# Patient Record
Sex: Female | Born: 1995 | Race: Black or African American | Hispanic: No | Marital: Single
Health system: Southern US, Community
[De-identification: ages and names within clinical notes are randomized; demographics above are authoritative.]

## PROBLEM LIST (undated history)

## (undated) HISTORY — PX: OTHER SURGICAL HISTORY: SHX169

---
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
## 2013-06-16 ENCOUNTER — Other Ambulatory Visit (HOSPITAL_COMMUNITY)
Admission: RE | Admit: 2013-06-16 | Discharge: 2013-06-16 | Disposition: A | Discharge status: Home / Self Care | Payer: 59 | Source: Ambulatory Visit | Attending: Emergency Medicine | Admitting: Emergency Medicine

## 2013-06-16 ENCOUNTER — Encounter (HOSPITAL_COMMUNITY): Payer: Self-pay | Admitting: Emergency Medicine

## 2013-06-16 ENCOUNTER — Emergency Department (HOSPITAL_COMMUNITY)
Admission: EM | Admit: 2013-06-16 | Discharge: 2013-06-16 | Disposition: A | Discharge status: Home / Self Care | Payer: 59 | Source: Home / Self Care | Attending: Emergency Medicine | Admitting: Emergency Medicine

## 2013-06-16 DIAGNOSIS — N760 Acute vaginitis: Secondary | ICD-10-CM

## 2013-06-16 DIAGNOSIS — B9689 Other specified bacterial agents as the cause of diseases classified elsewhere: Secondary | ICD-10-CM

## 2013-06-16 DIAGNOSIS — N390 Urinary tract infection, site not specified: Secondary | ICD-10-CM

## 2013-06-16 DIAGNOSIS — Z113 Encounter for screening for infections with a predominantly sexual mode of transmission: Secondary | ICD-10-CM | POA: Insufficient documentation

## 2013-06-16 DIAGNOSIS — A499 Bacterial infection, unspecified: Secondary | ICD-10-CM

## 2013-06-16 LAB — POCT URINALYSIS DIP (DEVICE)
Glucose, UA: NEGATIVE mg/dL
Protein, ur: 100 mg/dL — AB
Urobilinogen, UA: 0.2 mg/dL (ref 0.0–1.0)

## 2013-06-16 MED ORDER — CEPHALEXIN 500 MG PO CAPS: 500.0000 mg | ORAL_CAPSULE | Freq: Three times a day (TID) | ORAL | Status: DC

## 2013-06-16 MED ORDER — METRONIDAZOLE 500 MG PO TABS: 500.0000 mg | ORAL_TABLET | Freq: Two times a day (BID) | ORAL | Status: DC

## 2013-06-16 NOTE — ED Notes (Signed)
Pt reports  Symptoms  Of  Low  Back  Pain    With  Cloudy  Urination  As  Well as  A  Vaginal  Discharge         Symptoms  Began today

## 2013-06-16 NOTE — ED Provider Notes (Signed)
Chief Complaint:   Chief Complaint  Patient presents with  . Back Pain    History of Present Illness:   Courtney Klein is a 17 year old female who has a history of lower back pain since 11 AM today. The pain is now gone away completely. She denies any injury to the back or radiation the pain down the legs, numbness, tingling, or weakness in the legs. She had a urinary tract infection years ago and states this feels like the urinary tract infection did. She denies any fever, chills, sweats, nausea, vomiting, dysuria, frequency, urgency, or hematuria. She has had some vaginal discharge over the past 2 weeks. She states this has a sweet odor. She denies any malodor, itching, irritation, or pelvic pain. Her menses have been regular, her last menstrual period being October 12. She is sexually active with consistent use of condoms.  Review of Systems:  Other than noted above, the patient denies any of the following symptoms: General:  No fevers, chills, sweats, aches, or fatigue. GI:  No abdominal pain, back pain, nausea, vomiting, diarrhea, or constipation. GU:  No dysuria, frequency, urgency, hematuria, or incontinence. GYN:  No discharge, itching, vulvar pain or lesions, pelvic pain, or abnormal vaginal bleeding.  PMFSH:  Past medical history, family history, social history, meds, and allergies were reviewed.    Physical Exam:   Vital signs:  BP 96/65  Pulse 75  Temp(Src) 98.2 F (36.8 C) (Oral)  Resp 16  SpO2 100%  LMP 06/09/2013 Gen:  Alert, oriented, in no distress. Lungs:  Clear to auscultation, no wheezes, rales or rhonchi. Heart:  Regular rhythm, no gallop or murmer. Abdomen:  Flat and soft. There was slight suprapubic pain to palpation.  No guarding, or rebound.  No hepato-splenomegaly or mass.  Bowel sounds were normally active.  No hernia. Pelvic exam:  Normal external genitalia, vaginal and cervical mucosa were normal. There was a white, malodorous, frothy discharge. No pain on  cervical motion. Uterus was midposition, normal in size and shape and nontender. No adnexal masses or tenderness. DNA probe for gonorrhea, Chlamydia, Trichomonas, Gardnerella, Candida were obtained. Back:  No CVA tenderness.  Skin:  Clear, warm and dry.  Labs:    Results for orders placed during the hospital encounter of 06/16/13  POCT URINALYSIS DIP (DEVICE)      Result Value Range   Glucose, UA NEGATIVE  NEGATIVE mg/dL   Bilirubin Urine NEGATIVE  NEGATIVE   Ketones, ur NEGATIVE  NEGATIVE mg/dL   Specific Gravity, Urine 1.020  1.005 - 1.030   Hgb urine dipstick MODERATE (*) NEGATIVE   pH 8.5 (*) 5.0 - 8.0   Protein, ur 100 (*) NEGATIVE mg/dL   Urobilinogen, UA 0.2  0.0 - 1.0 mg/dL   Nitrite NEGATIVE  NEGATIVE   Leukocytes, UA SMALL (*) NEGATIVE     A urine culture was obtained.  Results are pending at this time and we will call about any positive results.  Assessment: The primary encounter diagnosis was UTI (lower urinary tract infection). A diagnosis of Bacterial vaginosis was also pertinent to this visit.   The back pain could have been due to a urinary tract infection or could be musculoskeletal pain. A culture has been obtained and we'll go ahead and treat. We'll also go ahead and treat for bacterial vaginosis, pending results of DNA probes. Suggested a probiotic for prevention of both urinary tract infection and bacterial vaginosis.  Plan:   1.  Meds:  The following meds were prescribed:  Discharge Medication List as of 06/16/2013  4:29 PM    START taking these medications   Details  cephALEXin (KEFLEX) 500 MG capsule Take 1 capsule (500 mg total) by mouth 3 (three) times daily., Starting 06/16/2013, Until Discontinued, Normal    metroNIDAZOLE (FLAGYL) 500 MG tablet Take 1 tablet (500 mg total) by mouth 2 (two) times daily., Starting 06/16/2013, Until Discontinued, Normal        2.  Patient Education/Counseling:  The patient was given appropriate handouts, self care  instructions, and instructed in symptomatic relief. The patient was told to avoid intercourse for 10 days, get extra fluids, and return for a follow up with her primary care doctor at the completion of treatment for a repeat UA and culture.   3.  Follow up:  The patient was told to follow up if no better in 3 to 4 days, if becoming worse in any way, and given some red flag symptoms such as fever, persistent vomiting, or severe pain which would prompt immediate return.  Follow up here if necessary.     Reuben Likes, MD 06/16/13 [phone]

## 2013-06-17 LAB — URINE CULTURE: Colony Count: NO GROWTH

## 2013-06-17 NOTE — ED Notes (Signed)
GC/Chlamydia neg., Affirm: Candida and Trich neg., Gardnerella pos., Urine culture: No growth.  Pt. adequately treated with Flagyl. Vassie Moselle 06/17/2013

## 2013-07-28 ENCOUNTER — Emergency Department (HOSPITAL_COMMUNITY)
Admission: EM | Admit: 2013-07-28 | Discharge: 2013-07-29 | Discharge status: Against Medical Advice | Payer: 59 | Attending: Emergency Medicine | Admitting: Emergency Medicine

## 2013-07-28 ENCOUNTER — Encounter (HOSPITAL_COMMUNITY): Payer: Self-pay | Admitting: Emergency Medicine

## 2013-07-28 DIAGNOSIS — J309 Allergic rhinitis, unspecified: Secondary | ICD-10-CM | POA: Insufficient documentation

## 2013-07-28 DIAGNOSIS — R059 Cough, unspecified: Secondary | ICD-10-CM | POA: Insufficient documentation

## 2013-07-28 DIAGNOSIS — R05 Cough: Secondary | ICD-10-CM | POA: Insufficient documentation

## 2013-07-28 DIAGNOSIS — Z79899 Other long term (current) drug therapy: Secondary | ICD-10-CM | POA: Insufficient documentation

## 2013-07-28 DIAGNOSIS — R109 Unspecified abdominal pain: Secondary | ICD-10-CM | POA: Insufficient documentation

## 2013-07-28 LAB — URINE MICROSCOPIC-ADD ON

## 2013-07-28 LAB — URINALYSIS, ROUTINE W REFLEX MICROSCOPIC
Glucose, UA: NEGATIVE mg/dL
Specific Gravity, Urine: 1.027 (ref 1.005–1.030)
Urobilinogen, UA: 1 mg/dL (ref 0.0–1.0)
pH: 6 (ref 5.0–8.0)

## 2013-07-28 LAB — POCT PREGNANCY, URINE: Preg Test, Ur: NEGATIVE

## 2013-07-28 NOTE — ED Notes (Signed)
Pt called by registation without answer

## 2013-07-28 NOTE — ED Notes (Signed)
Pt. reports left flank pain with concentrated cloudy urine for 3 days . Pt. also reported rhinorrhea and productive cough for several days , denies fever or chills.

## 2013-07-28 NOTE — ED Notes (Signed)
Pt called with no answer

## 2013-07-28 NOTE — ED Notes (Signed)
Patient called several times without any response.

## 2013-07-29 ENCOUNTER — Encounter (HOSPITAL_COMMUNITY): Payer: Self-pay | Admitting: Emergency Medicine

## 2013-07-29 ENCOUNTER — Emergency Department (HOSPITAL_COMMUNITY)
Admission: EM | Admit: 2013-07-29 | Discharge: 2013-07-29 | Disposition: A | Discharge status: Home / Self Care | Payer: 59 | Source: Home / Self Care

## 2013-07-29 DIAGNOSIS — J329 Chronic sinusitis, unspecified: Secondary | ICD-10-CM

## 2013-07-29 DIAGNOSIS — R0982 Postnasal drip: Secondary | ICD-10-CM

## 2013-07-29 DIAGNOSIS — J9801 Acute bronchospasm: Secondary | ICD-10-CM

## 2013-07-29 DIAGNOSIS — I889 Nonspecific lymphadenitis, unspecified: Secondary | ICD-10-CM

## 2013-07-29 DIAGNOSIS — R05 Cough: Secondary | ICD-10-CM

## 2013-07-29 DIAGNOSIS — R82998 Other abnormal findings in urine: Secondary | ICD-10-CM

## 2013-07-29 LAB — POCT PREGNANCY, URINE: Preg Test, Ur: NEGATIVE

## 2013-07-29 LAB — POCT URINALYSIS DIP (DEVICE)
Bilirubin Urine: NEGATIVE
Glucose, UA: NEGATIVE mg/dL
Specific Gravity, Urine: >=1.03 (ref 1.005–1.030)
Urobilinogen, UA: 0.2 mg/dL (ref 0.0–1.0)

## 2013-07-29 MED ORDER — DESLORATADINE 5 MG PO TABS: 5.0000 mg | ORAL_TABLET | Freq: Every day | ORAL | Status: AC

## 2013-07-29 MED ORDER — PREDNISONE 20 MG PO TABS: ORAL_TABLET | ORAL | Status: DC

## 2013-07-29 MED ORDER — FLUTICASONE PROPIONATE 50 MCG/ACT NA SUSP: 2.0000 | Freq: Every day | NASAL | Status: DC

## 2013-07-29 MED ORDER — ALBUTEROL SULFATE HFA 108 (90 BASE) MCG/ACT IN AERS: 2.0000 | INHALATION_SPRAY | Freq: Four times a day (QID) | RESPIRATORY_TRACT | Status: DC | PRN

## 2013-07-29 MED ORDER — CEPHALEXIN 500 MG PO CAPS: 500.0000 mg | ORAL_CAPSULE | Freq: Four times a day (QID) | ORAL | Status: DC

## 2013-07-29 NOTE — ED Notes (Signed)
C/o chest congestion and cough x 2 months.  No relief with otc meds.   Chest tightness and sob.    Also having lower back pain. States was recently treated for uti but feels like symptoms have returned.   Denies fever, n/v/d

## 2013-07-29 NOTE — ED Provider Notes (Signed)
Medical screening examination/treatment/procedure(s) were performed by resident physician or non-physician practitioner and as supervising physician I was immediately available for consultation/collaboration.   Erilyn Pearman DOUGLAS MD.   Bravlio Luca D Jeanett Antonopoulos, MD 07/29/13 1712 

## 2013-07-29 NOTE — ED Notes (Signed)
Pt name called with no answer 

## 2013-07-29 NOTE — ED Provider Notes (Signed)
CSN: [id number]     Arrival date & time 07/29/13  1002 History   First MD Initiated Contact with Patient 07/29/13 1027     Chief Complaint  Patient presents with  . Cough  . Back Pain   (Consider location/radiation/quality/duration/timing/severity/associated sxs/prior Treatment) HPI Comments: 17 y o F with cough for 2 mo. PND, nasal congestion. Denies fevers, ST or dyspnea. C/O L lower back pain over the 8th and 9th ribs, worse with rotation of the torso and with certain positions.   Patient is a 17 y.o. female presenting with cough and back pain.  Cough Associated symptoms: rhinorrhea   Associated symptoms: no chest pain, no ear pain, no headaches, no myalgias, no shortness of breath and no sore throat   Back Pain Associated symptoms: no chest pain, no dysuria, no headaches and no pelvic pain     History reviewed. No pertinent past medical history. History reviewed. No pertinent past surgical history. History reviewed. No pertinent family history. History  Substance Use Topics  . Smoking status: Never Smoker   . Smokeless tobacco: Not on file  . Alcohol Use: No   OB History   Grav Para Term Preterm Abortions TAB SAB Ect Mult Living                 Review of Systems  Constitutional: Negative.   HENT: Positive for congestion, postnasal drip and rhinorrhea. Negative for ear discharge, ear pain, hearing loss, sore throat and tinnitus.   Eyes: Negative.   Respiratory: Positive for cough. Negative for shortness of breath.   Cardiovascular: Negative for chest pain and palpitations.  Gastrointestinal: Negative.   Genitourinary: Negative for dysuria, urgency, frequency, flank pain, vaginal bleeding, vaginal discharge, difficulty urinating and pelvic pain.  Musculoskeletal: Positive for back pain. Negative for gait problem and myalgias.  Skin: Negative.   Neurological: Negative for dizziness, syncope, speech difficulty and headaches.    Allergies  Review of patient's allergies  indicates no known allergies.  Home Medications   Current Outpatient Rx  Name  Route  Sig  Dispense  Refill  . acetaminophen (TYLENOL) 325 MG tablet   Oral   Take 650 mg by mouth every 6 (six) hours as needed.         Marland Kitchen albuterol (PROVENTIL HFA;VENTOLIN HFA) 108 (90 BASE) MCG/ACT inhaler   Inhalation   Inhale 2 puffs into the lungs every 6 (six) hours as needed for wheezing or shortness of breath.   1 Inhaler   0   . cephALEXin (KEFLEX) 500 MG capsule   Oral   Take 1 capsule (500 mg total) by mouth 3 (three) times daily.   30 capsule   0   . desloratadine (CLARINEX) 5 MG tablet   Oral   Take 1 tablet (5 mg total) by mouth daily.   30 tablet   0   . fluticasone (FLONASE) 50 MCG/ACT nasal spray   Each Nare   Place 2 sprays into both nostrils daily.   16 g   2   . ibuprofen (ADVIL,MOTRIN) 200 MG tablet   Oral   Take 200 mg by mouth every 6 (six) hours as needed.         . metroNIDAZOLE (FLAGYL) 500 MG tablet   Oral   Take 1 tablet (500 mg total) by mouth 2 (two) times daily.   14 tablet   0   . OVER THE COUNTER MEDICATION      aminophen         .  predniSONE (DELTASONE) 20 MG tablet      2 tabs po once daily x 4 days then 1 q d x 3 d. Take with food.   11 tablet   0    BP 102/66  Pulse 94  Temp(Src) 98 F (36.7 C) (Oral)  Resp 16  SpO2 98%  LMP 07/06/2013 Physical Exam  Nursing note and vitals reviewed. Constitutional: She is oriented to person, place, and time. She appears well-developed and well-nourished. No distress.  HENT:  Right Ear: External ear normal.  Left Ear: External ear normal.  Minor postpharyngeal erythema. No exudates. No swelling. Bilateral TMs are normal  Eyes: Conjunctivae and EOM are normal.  Neck: Normal range of motion. Neck supple.  Solitary 0.75 cm lymph node left postauricular. Left mildly enlarged and tender posterior cervical lymph nodes.  Cardiovascular: Normal rate, regular rhythm and normal heart sounds.    Pulmonary/Chest: Effort normal and breath sounds normal. No respiratory distress. She has no wheezes. She has no rales. She exhibits tenderness.  Tenderness over the left posterior ninth and 10th ribs and intercostal muscles. No tenderness or pain along the spine or paraspinal muscles. Demonstrates no evidence of shortness of breath, increased effort, tachypnea or abnormal breath sounds. Speech is clear, smooth, relaxed in full sentences.  Musculoskeletal: Normal range of motion. She exhibits no edema.  Lymphadenopathy:    She has cervical adenopathy.  Neurological: She is alert and oriented to person, place, and time.  Skin: Skin is warm and dry. No rash noted.  Psychiatric: She has a normal mood and affect.    ED Course  Procedures (including critical care time) Labs Review Labs Reviewed  POCT URINALYSIS DIP (DEVICE) - Abnormal; Notable for the following:    Hgb urine dipstick TRACE (*)    Leukocytes, UA TRACE (*)    All other components within normal limits  POCT PREGNANCY, URINE  POCT INFECTIOUS MONO SCREEN   Results for orders placed during the hospital encounter of 07/29/13  POCT URINALYSIS DIP (DEVICE)      Result Value Range   Glucose, UA NEGATIVE  NEGATIVE mg/dL   Bilirubin Urine NEGATIVE  NEGATIVE   Ketones, ur NEGATIVE  NEGATIVE mg/dL   Specific Gravity, Urine >=1.030  1.005 - 1.030   Hgb urine dipstick TRACE (*) NEGATIVE   pH 6.0  5.0 - 8.0   Protein, ur NEGATIVE  NEGATIVE mg/dL   Urobilinogen, UA 0.2  0.0 - 1.0 mg/dL   Nitrite NEGATIVE  NEGATIVE   Leukocytes, UA TRACE (*) NEGATIVE  POCT PREGNANCY, URINE      Result Value Range   Preg Test, Ur NEGATIVE  NEGATIVE  POCT INFECTIOUS MONO SCREEN      Result Value Range   Mono Screen NEGATIVE  NEGATIVE    Imaging Review No results found. Peak Flow est nl is 430.  Results [phone].   MDM   1. Cough   2. Bronchospasm   3. PND (post-nasal drip)   4. Rhinosinusitis   5. Cervical lymphadenitis    Suspect  combo of ocult bronchospasm and persistent PND is etio for cough. Cont to observe lymph nodes, if not improving this week see your doctor. Prednisone flonase clarinex Albuterol Plenty of fluids F/U woth PCP as needed Cludy urine with WBC's, tx with Keflex      Hayden Rasmussen, NP 07/29/13 1222

## 2013-07-31 LAB — URINE CULTURE

## 2013-08-01 NOTE — ED Notes (Signed)
Call patient If these to see if abnormal clinical findings persist.If no urionary symptoms anymore,she can stop cephalexin.If symptoms persist,then give Bactrim DS 1 tab BID x 3 days per Fayrene Helper.

## 2013-08-05 ENCOUNTER — Telehealth (HOSPITAL_COMMUNITY): Payer: Self-pay | Admitting: Emergency Medicine

## 2014-01-09 ENCOUNTER — Other Ambulatory Visit: Payer: Self-pay | Admitting: Family

## 2014-01-09 ENCOUNTER — Ambulatory Visit
Admission: RE | Admit: 2014-01-09 | Discharge: 2014-01-09 | Disposition: A | Discharge status: Home / Self Care | Payer: 59 | Source: Ambulatory Visit | Attending: Family | Admitting: Family

## 2014-01-09 DIAGNOSIS — R059 Cough, unspecified: Secondary | ICD-10-CM

## 2014-01-09 DIAGNOSIS — R05 Cough: Secondary | ICD-10-CM

## 2015-10-20 IMAGING — CR DG CHEST 2V
2 series · 2 of 2 positions shown · non-contrast
Comparison: None.

CLINICAL DATA: Cough

EXAM:
CHEST  2 VIEW

[w chest pa]
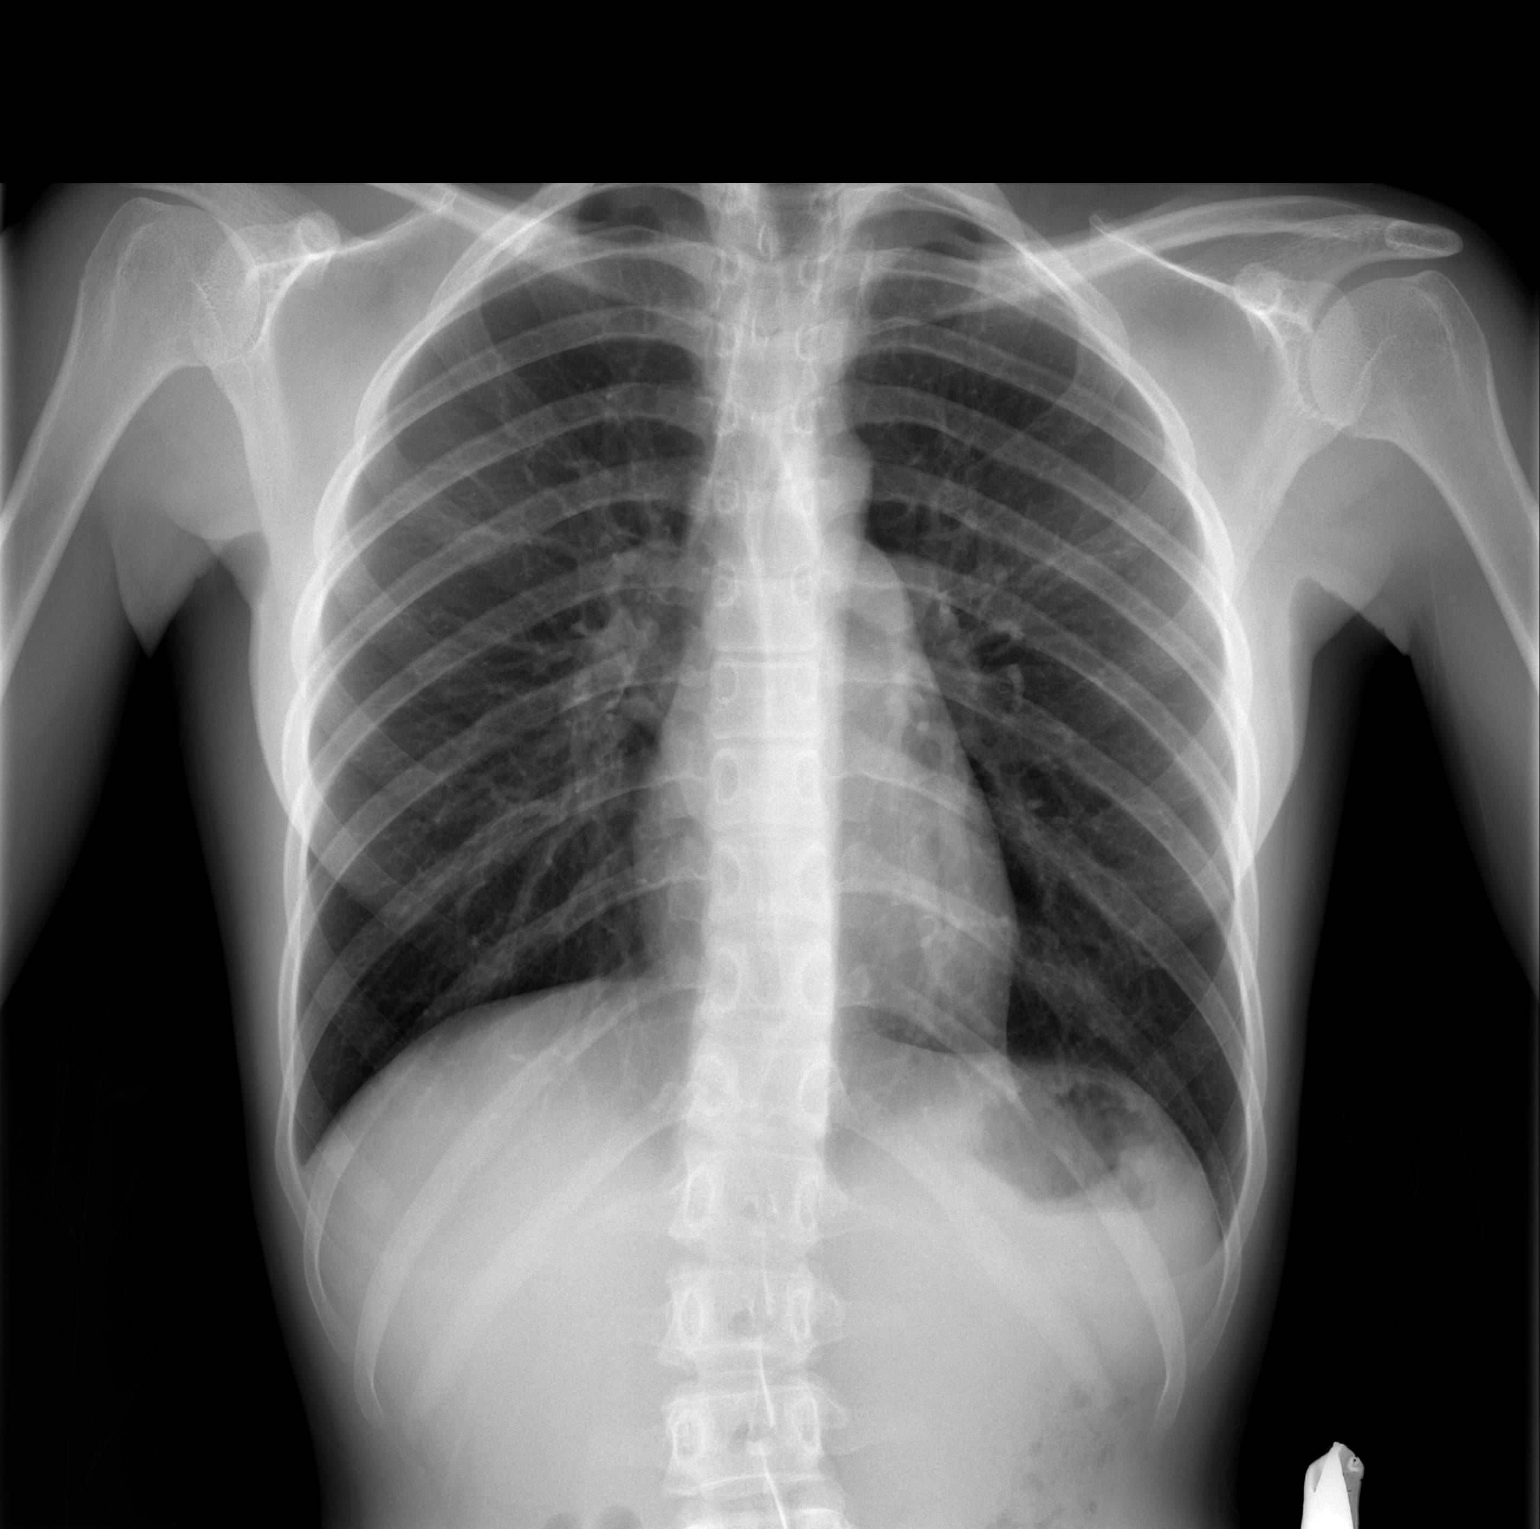

[w chest lat]
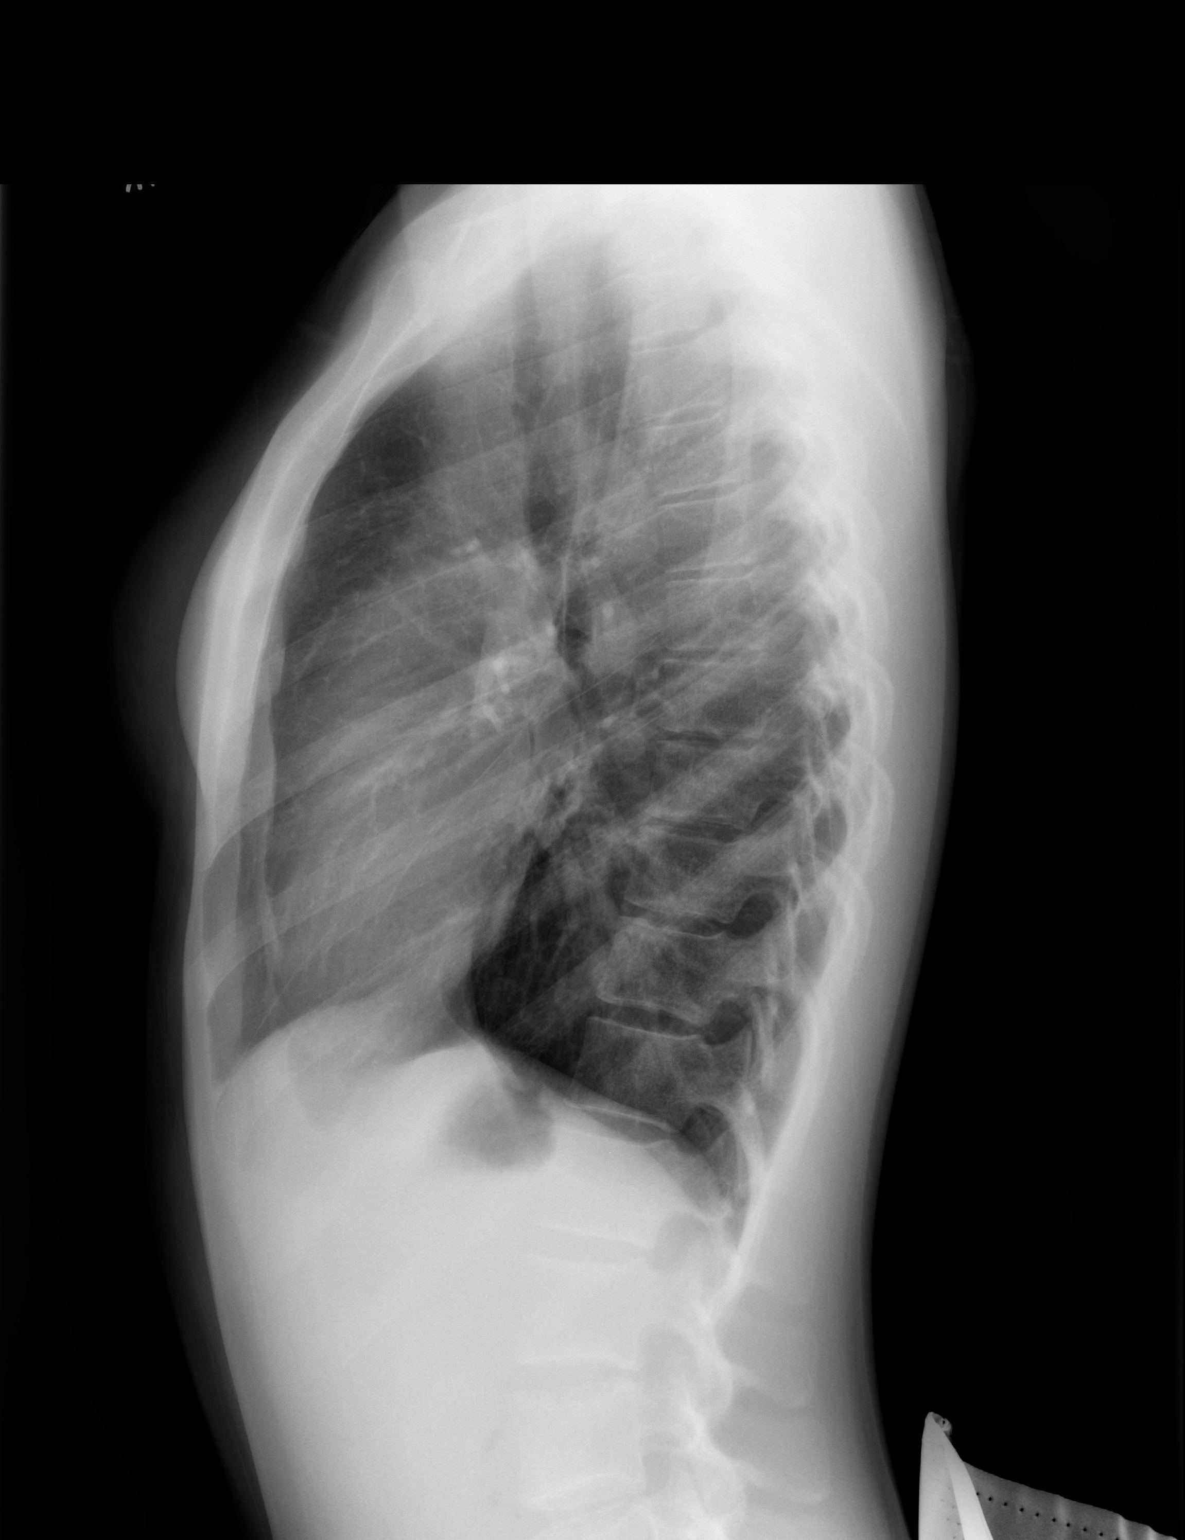

[2 of 2 positions shown; findings below may reference images not displayed]

FINDINGS: Lungs are clear. Heart size and pulmonary vascularity are normal. No
adenopathy. No bone lesions. There is upper lumbar dextroscoliosis.
IMPRESSION: No edema or consolidation.

## 2017-01-14 ENCOUNTER — Telehealth: Payer: Self-pay

## 2017-01-14 ENCOUNTER — Encounter: Payer: Self-pay | Admitting: Allergy and Immunology

## 2017-01-14 ENCOUNTER — Ambulatory Visit (INDEPENDENT_AMBULATORY_CARE_PROVIDER_SITE_OTHER): Payer: 59 | Admitting: Allergy and Immunology

## 2017-01-14 VITALS — BP 92/60 | HR 84 | Temp 98.2°F | Resp 16 | Ht 62.6 in | Wt 113.3 lb

## 2017-01-14 DIAGNOSIS — J4520 Mild intermittent asthma, uncomplicated: Secondary | ICD-10-CM | POA: Diagnosis not present

## 2017-01-14 DIAGNOSIS — H1010 Acute atopic conjunctivitis, unspecified eye: Secondary | ICD-10-CM | POA: Insufficient documentation

## 2017-01-14 DIAGNOSIS — J3089 Other allergic rhinitis: Secondary | ICD-10-CM

## 2017-01-14 DIAGNOSIS — H1013 Acute atopic conjunctivitis, bilateral: Secondary | ICD-10-CM

## 2017-01-14 MED ORDER — OLOPATADINE HCL 0.1 % OP SOLN: 1.0000 [drp] | Freq: Two times a day (BID) | OPHTHALMIC | 12 refills | Status: DC

## 2017-01-14 MED ORDER — MONTELUKAST SODIUM 10 MG PO TABS: 10.0000 mg | ORAL_TABLET | Freq: Every day | ORAL | 5 refills | Status: DC

## 2017-01-14 MED ORDER — ALBUTEROL SULFATE HFA 108 (90 BASE) MCG/ACT IN AERS: 1.0000 | INHALATION_SPRAY | RESPIRATORY_TRACT | 1 refills | Status: DC | PRN

## 2017-01-14 MED ORDER — ALCAFTADINE 0.25 % OP SOLN: OPHTHALMIC | 5 refills | Status: AC

## 2017-01-14 MED ORDER — FLUTICASONE PROPIONATE 50 MCG/ACT NA SUSP: 2.0000 | Freq: Every day | NASAL | 2 refills | Status: AC

## 2017-01-14 MED ORDER — LEVOCETIRIZINE DIHYDROCHLORIDE 5 MG PO TABS: 5.0000 mg | ORAL_TABLET | Freq: Every day | ORAL | 5 refills | Status: DC | PRN

## 2017-01-14 NOTE — Assessment & Plan Note (Signed)
   Treatment plan as outlined above for allergic rhinitis.  A prescription has been provided for Pataday, one drop per eye daily as needed.  I have also recommended eye lubricant drops (i.e., Natural Tears) as needed. 

## 2017-01-14 NOTE — Assessment & Plan Note (Signed)
   Aeroallergen avoidance measures have been discussed and provided in written form.  A prescription has been provided for levocetirizine, 5mg  daily as needed.  A prescription has been provided for montelukast 10 mg daily at bedtime.  A prescription has been provided for Nasonex nasal spray, one spray per nostril 1-2 times daily as needed. Proper nasal spray technique has been discussed and demonstrated.  I have also recommended nasal saline spray (i.e., Simply Saline) or nasal saline lavage (i.e., NeilMed) as needed and prior to medicated nasal sprays.  If allergen avoidance measures and medications fail to adequately relieve symptoms, aeroallergen immunotherapy will be considered.

## 2017-01-14 NOTE — Telephone Encounter (Signed)
[place] [employer], will not cover olopatadine 0.1%.  Preferred is Lastacaft Sol.  OK per Dr. Nunzio CobbsBobbitt to change. Instill one drop in each eye once daily.

## 2017-01-14 NOTE — Patient Instructions (Addendum)
Seasonal and perennial allergic rhinitis  Aeroallergen avoidance measures have been discussed and provided in written form.  A prescription has been provided for levocetirizine, 5mg  daily as needed.  A prescription has been provided for montelukast 10 mg daily at bedtime.  A prescription has been provided for Nasonex nasal spray, one spray per nostril 1-2 times daily as needed. Proper nasal spray technique has been discussed and demonstrated.  I have also recommended nasal saline spray (i.e., Simply Saline) or nasal saline lavage (i.e., NeilMed) as needed and prior to medicated nasal sprays.  If allergen avoidance measures and medications fail to adequately relieve symptoms, aeroallergen immunotherapy will be considered.  Allergic conjunctivitis  Treatment plan as outlined above for allergic rhinitis.  A prescription has been provided for Pataday, one drop per eye daily as needed.  I have also recommended eye lubricant drops (i.e., Natural Tears) as needed.  Mild intermittent asthma  Montelukast has been prescribed a (as above).  A prescription has been provided for albuterol HFA, 1-2 inhalations every 4-6 hours as needed and 15 minutes prior to exercise.  Subjective and objective measures of pulmonary function will be followed and the treatment plan will be adjusted accordingly.   Return in about 3 months (around 04/16/2017), or if symptoms worsen or fail to improve.  Reducing Pollen Exposure  The American Academy of Allergy, Asthma and Immunology suggests the following steps to reduce your exposure to pollen during allergy seasons.    1. Do not hang sheets or clothing out to dry; pollen may collect on these items. 2. Do not mow lawns or spend time around freshly cut grass; mowing stirs up pollen. 3. Keep windows closed at night.  Keep car windows closed while driving. 4. Minimize morning activities outdoors, a time when pollen counts are usually at their highest. 5. Stay  indoors as much as possible when pollen counts or humidity is high and on windy days when pollen tends to remain in the air longer. 6. Use air conditioning when possible.  Many air conditioners have filters that trap the pollen spores. 7. Use a HEPA room air filter to remove pollen form the indoor air you breathe.   Control of Dog or Cat Allergen  Avoidance is the best way to manage a dog or cat allergy. If you have a dog or cat and are allergic to dog or cats, consider removing the dog or cat from the home. If you have a dog or cat but don't want to find it a new home, or if your family wants a pet even though someone in the household is allergic, here are some strategies that may help keep symptoms at bay:  1. Keep the pet out of your bedroom and restrict it to only a few rooms. Be advised that keeping the dog or cat in only one room will not limit the allergens to that room. 2. Don't pet, hug or kiss the dog or cat; if you do, wash your hands with soap and water. 3. High-efficiency particulate air (HEPA) cleaners run continuously in a bedroom or living room can reduce allergen levels over time. 4. Regular use of a high-efficiency vacuum cleaner or a central vacuum can reduce allergen levels. 5. Giving your dog or cat a bath at least once a week can reduce airborne allergen.  Control of Mold Allergen  Mold and fungi can grow on a variety of surfaces provided certain temperature and moisture conditions exist.  Outdoor molds grow on plants, decaying vegetation and  soil.  The major outdoor mold, Alternaria and Cladosporium, are found in very high numbers during hot and dry conditions.  Generally, a late Summer - Fall peak is seen for common outdoor fungal spores.  Rain will temporarily lower outdoor mold spore count, but counts rise rapidly when the rainy period ends.  The most important indoor molds are Aspergillus and Penicillium.  Dark, humid and poorly ventilated basements are ideal sites for  mold growth.  The next most common sites of mold growth are the bathroom and the kitchen.  Outdoor MicrosoftMold Control 1. Use air conditioning and keep windows closed 2. Avoid exposure to decaying vegetation. 3. Avoid leaf raking. 4. Avoid grain handling. 5. Consider wearing a face mask if working in moldy areas.  Indoor Mold Control 1. Maintain humidity below 50%. 2. Clean washable surfaces with 5% bleach solution. 3. Remove sources e.g. Contaminated carpets.

## 2017-01-14 NOTE — Progress Notes (Signed)
New Patient Note  RE: Courtney Klein MRN: [id number] DOB: [birth year] Date of Office Visit: 01/14/2017  Referring provider: No ref. provider found Primary care provider: Patient, No Pcp Per  Chief Complaint: Allergic Rhinitis ; Cough; and Breathing Problem   History of present illness: Courtney Klein is a 21 y.o. female presenting today for evaluation of allergic rhinitis and breathing problems.  She is accompanied today by her mother who assists with a history.  She complains of frequent nasal congestion, rhinorrhea, sneezing, postnasal drainage, nasal pruritus, ocular pruritus, and sinus pressure over the forehead, between the eyes, and over the cheekbones.  The symptoms occur year around but are most frequent and severe during the springtime.  She also complains of coughing, chest tightness, and dyspnea.  She has experienced these symptoms over the past 2 years.  The coughing is worse at nighttime and the dyspnea/chest tightness can be triggered by mild/moderate exertion.   Assessment and plan: Seasonal and perennial allergic rhinitis  Aeroallergen avoidance measures have been discussed and provided in written form.  A prescription has been provided for levocetirizine, 5mg  daily as needed.  A prescription has been provided for montelukast 10 mg daily at bedtime.  A prescription has been provided for Nasonex nasal spray, one spray per nostril 1-2 times daily as needed. Proper nasal spray technique has been discussed and demonstrated.  I have also recommended nasal saline spray (i.e., Simply Saline) or nasal saline lavage (i.e., NeilMed) as needed and prior to medicated nasal sprays.  If allergen avoidance measures and medications fail to adequately relieve symptoms, aeroallergen immunotherapy will be considered.  Allergic conjunctivitis  Treatment plan as outlined above for allergic rhinitis.  A prescription has been provided for Pataday, one drop per eye daily as needed.  I have  also recommended eye lubricant drops (i.e., Natural Tears) as needed.  Mild intermittent asthma  Montelukast has been prescribed a (as above).  A prescription has been provided for albuterol HFA, 1-2 inhalations every 4-6 hours as needed and 15 minutes prior to exercise.  Subjective and objective measures of pulmonary function will be followed and the treatment plan will be adjusted accordingly.   Meds ordered this encounter  Medications  . levocetirizine (XYZAL) 5 MG tablet    Sig: Take 1 tablet (5 mg total) by mouth daily as needed for allergies.    Dispense:  30 tablet    Refill:  5  . montelukast (SINGULAIR) 10 MG tablet    Sig: Take 1 tablet (10 mg total) by mouth at bedtime.    Dispense:  30 tablet    Refill:  5  . albuterol (PROVENTIL HFA;VENTOLIN HFA) 108 (90 Base) MCG/ACT inhaler    Sig: Inhale 1-2 puffs into the lungs every 4 (four) hours as needed for wheezing or shortness of breath.    Dispense:  2 Inhaler    Refill:  1  . fluticasone (FLONASE) 50 MCG/ACT nasal spray    Sig: Place 2 sprays into both nostrils daily.    Dispense:  16 g    Refill:  2  . olopatadine (PATANOL) 0.1 % ophthalmic solution    Sig: Place 1 drop into both eyes 2 (two) times daily.    Dispense:  5 mL    Refill:  12    Diagnostics: Spirometry: FVC is 3.38 L and FEV1 is 3.32 L. This study was performed while the patient was asymptomatic.  Please see scanned spirometry results for details. Epicutaneous testing: Robust positive to grass  pollens, weed pollens, ragweed pollen, and tree pollens. Intradermal testing: Positive to molds and cat hair.    Physical examination: Blood pressure 92/60, pulse 84, temperature 98.2 F (36.8 C), temperature source Oral, resp. rate 16, height 5' 2.6" (1.59 m), weight 113 lb 5.1 oz (51.4 kg), SpO2 95 %.  General: Alert, interactive, in no acute distress. HEENT: TMs pearly gray, turbinates edematous and pale without discharge, post-pharynx moderately  erythematous. Neck: Supple without lymphadenopathy. Lungs: Clear to auscultation without wheezing, rhonchi or rales. CV: Normal S1, S2 without murmurs. Abdomen: Nondistended, nontender. Skin: Warm and dry, without lesions or rashes. Extremities:  No clubbing, cyanosis or edema. Neuro:   Grossly intact.  Review of systems:  Review of systems negative except as noted in HPI / PMHx or noted below: Review of Systems  Constitutional: Negative.   HENT: Negative.   Eyes: Negative.   Respiratory: Negative.   Cardiovascular: Negative.   Gastrointestinal: Negative.   Genitourinary: Negative.   Musculoskeletal: Negative.   Skin: Negative.   Neurological: Negative.   Endo/Heme/Allergies: Negative.   Psychiatric/Behavioral: Negative.     Past medical history:  History reviewed. No pertinent past medical history.  Past surgical history:  Past Surgical History:  Procedure Laterality Date  . no past surgery      Family history: Family History  Problem Relation Age of Onset  . Migraines Mother   . Allergic rhinitis Sister   . Asthma Sister   . Food Allergy Sister   . Eczema Sister   . Angioedema Neg Hx   . Immunodeficiency Neg Hx   . Urticaria Neg Hx     Social history: Social History   Social History  . Marital status: Single    Spouse name: N/A  . Number of children: N/A  . Years of education: N/A   Occupational History  . Not on file.   Social History Main Topics  . Smoking status: Passive Smoke Exposure - Never Smoker  . Smokeless tobacco: Never Used  . Alcohol use No  . Drug use: No  . Sexual activity: No   Other Topics Concern  . Not on file   Social History Narrative  . No narrative on file   Environmental History: The patient lives in an apartment which is less than 23 years old, with carpeting in the bedroom, and central air/heat.  There is a cat in the apartment which has access to her bedroom.  There is no known mold/water damage in the apartment.   She is a nonsmoker.  Allergies as of 01/14/2017   No Known Allergies     Medication List       Accurate as of 01/14/17  1:46 PM. Always use your most recent med list.          acetaminophen 325 MG tablet Commonly known as:  TYLENOL Take 650 mg by mouth every 6 (six) hours as needed.   albuterol 108 (90 Base) MCG/ACT inhaler Commonly known as:  PROVENTIL HFA;VENTOLIN HFA Inhale 1-2 puffs into the lungs every 4 (four) hours as needed for wheezing or shortness of breath.   cetirizine 10 MG tablet Commonly known as:  ZYRTEC Take 10 mg by mouth.   desloratadine 5 MG tablet Commonly known as:  CLARINEX Take 1 tablet (5 mg total) by mouth daily.   fluticasone 50 MCG/ACT nasal spray Commonly known as:  FLONASE Place 2 sprays into both nostrils daily.   ibuprofen 200 MG tablet Commonly known as:  ADVIL,MOTRIN Take 200 mg  by mouth every 6 (six) hours as needed.   levocetirizine 5 MG tablet Commonly known as:  XYZAL Take 1 tablet (5 mg total) by mouth daily as needed for allergies.   montelukast 10 MG tablet Commonly known as:  SINGULAIR Take 1 tablet (10 mg total) by mouth at bedtime.   olopatadine 0.1 % ophthalmic solution Commonly known as:  PATANOL Place 1 drop into both eyes 2 (two) times daily.       Known medication allergies: No Known Allergies  I appreciate the opportunity to take part in Manessa's care. Please do not hesitate to contact me with questions.  Sincerely,   R. Jorene Guest, MD

## 2017-01-14 NOTE — Assessment & Plan Note (Signed)
   Montelukast has been prescribed a (as above).  A prescription has been provided for albuterol HFA, 1-2 inhalations every 4-6 hours as needed and 15 minutes prior to exercise.  Subjective and objective measures of pulmonary function will be followed and the treatment plan will be adjusted accordingly.

## 2017-03-02 ENCOUNTER — Other Ambulatory Visit: Payer: Self-pay | Admitting: Allergy

## 2017-03-02 DIAGNOSIS — J3089 Other allergic rhinitis: Secondary | ICD-10-CM

## 2017-03-02 DIAGNOSIS — H1013 Acute atopic conjunctivitis, bilateral: Secondary | ICD-10-CM

## 2017-03-02 DIAGNOSIS — J4520 Mild intermittent asthma, uncomplicated: Secondary | ICD-10-CM

## 2017-03-02 MED ORDER — LEVOCETIRIZINE DIHYDROCHLORIDE 5 MG PO TABS: 5.0000 mg | ORAL_TABLET | Freq: Every evening | ORAL | 0 refills | Status: AC

## 2017-03-02 MED ORDER — MONTELUKAST SODIUM 10 MG PO TABS: 10.0000 mg | ORAL_TABLET | Freq: Every day | ORAL | 1 refills | Status: DC

## 2017-03-02 MED ORDER — MONTELUKAST SODIUM 10 MG PO TABS: 10.0000 mg | ORAL_TABLET | Freq: Every day | ORAL | 0 refills | Status: DC

## 2017-03-02 MED ORDER — LEVOCETIRIZINE DIHYDROCHLORIDE 5 MG PO TABS: 5.0000 mg | ORAL_TABLET | Freq: Every day | ORAL | 1 refills | Status: DC | PRN

## 2017-04-16 ENCOUNTER — Ambulatory Visit: Payer: 59 | Admitting: Allergy and Immunology

## 2017-04-16 DIAGNOSIS — J309 Allergic rhinitis, unspecified: Secondary | ICD-10-CM

## 2017-04-24 ENCOUNTER — Telehealth: Payer: Self-pay | Admitting: Allergy and Immunology

## 2017-04-24 NOTE — Telephone Encounter (Signed)
Please call pt mother back regarding a cancelled appointment in office on July 30th and said she still received a no show fee.

## 2017-04-24 NOTE — Telephone Encounter (Signed)
I wrote off first no show fee. I have informed mother.

## 2018-01-23 HISTORY — PX: ECTOPIC PREGNANCY SURGERY: SHX613

## 2018-11-18 ENCOUNTER — Ambulatory Visit: Payer: 59 | Admitting: Allergy and Immunology

## 2018-12-20 ENCOUNTER — Other Ambulatory Visit: Payer: Self-pay

## 2018-12-20 ENCOUNTER — Encounter: Payer: Self-pay | Admitting: Family Medicine

## 2018-12-20 ENCOUNTER — Ambulatory Visit (INDEPENDENT_AMBULATORY_CARE_PROVIDER_SITE_OTHER): Payer: 59 | Admitting: Family Medicine

## 2018-12-20 DIAGNOSIS — J3089 Other allergic rhinitis: Secondary | ICD-10-CM

## 2018-12-20 DIAGNOSIS — J4520 Mild intermittent asthma, uncomplicated: Secondary | ICD-10-CM

## 2018-12-20 DIAGNOSIS — H1013 Acute atopic conjunctivitis, bilateral: Secondary | ICD-10-CM | POA: Diagnosis not present

## 2018-12-20 MED ORDER — MONTELUKAST SODIUM 10 MG PO TABS: 10.0000 mg | ORAL_TABLET | Freq: Every day | ORAL | 5 refills | Status: AC

## 2018-12-20 MED ORDER — CETIRIZINE HCL 10 MG PO TABS: 10.0000 mg | ORAL_TABLET | Freq: Every day | ORAL | 5 refills | Status: AC | PRN

## 2018-12-20 MED ORDER — ALBUTEROL SULFATE HFA 108 (90 BASE) MCG/ACT IN AERS: 1.0000 | INHALATION_SPRAY | RESPIRATORY_TRACT | 2 refills | Status: AC | PRN

## 2018-12-20 NOTE — Patient Instructions (Addendum)
Mild intermittent asthma without complication Continue montelukast 10 mg once a day to prevent cough and wheeze Continue Proventil 2 puffs every 4 hours as needed for cough and wheeze  Seasonal and perennial allergic rhinitis Continue cetirizine 10 mg once a day as needed for a runny nose Continue Flonase 1-2 sprays in each nostril once a day as needed for a stuffy nose Consider nasal saline rinses as needed for nasal symptoms. Use before medicated sprays Allergen immunotherapy information packet mailed out today. Call us if you want to move forward with this option  Allergic conjunctivitis of both eyes Begin Pazeo eye drops one drop in each eye once a day as needed for red, itchy eyes  Continue the other medications as listed in your chart  Call the clinic if this treatment plan is not working well for you  Follow up in 2 months or sooner if needed

## 2018-12-20 NOTE — Progress Notes (Signed)
RE: Courtney Klein MRN: [id number] DOB: [birth year] Date of Telemedicine Visit: 12/20/2018  Referring provider: No ref. provider found Primary care provider: Patient, No Pcp Per  Chief Complaint: Allergies   Telemedicine Follow Up Visit via WebEx: I connected with Courtney Klein for a follow up on 12/20/18 by WebEx and verified that I am speaking with the correct person using two identifiers.   I discussed the limitations, risks, security and privacy concerns of performing an evaluation and management service by telemedicine and the availability of in person appointments. I also discussed with the patient that there may be a patient responsible charge related to this service. The patient expressed understanding and agreed to proceed.  Patient is at home Provider is at the office.  Visit start time: 10:00 Visit end time: 10:32 Insurance consent/check in by: Jennette BankerJC Coon Medical consent and medical assistant/nurse: Maryjean MornLogan Freeman  History of Present Illness: She is a 23 y.o. female, who is being followed for asthma, allergic rhinitis, and allergic conjunctivitis. Her previous allergy office visit was on 01/14/2017 with Thermon LeylandAnne Ambs, FNP. She reports her asthma has been well controlled with no shortness of breath, cough, or wheeze with activity or rest. She continues montelukast 10mg  once a day and has not needed her albuterol inhaler since her last visit to the office. Allergic rhinitis is reported as well controlled until she is exposed to her cat when she develops nasal congestion, rhinorrhea, and occular pruritus. At this time, she continues cetirizine once a day, Flonase daily, and allergy eye drops as needed, She is interested in beginning allergen immunotherapy. Her current medications are listed in the chart.   Assessment and Plan: Mild intermittent asthma without complication Continue montelukast 10 mg once a day to prevent cough and wheeze Continue Proventil 2 puffs every 4 hours as needed for  cough and wheeze  Seasonal and perennial allergic rhinitis Continue cetirizine 10 mg once a day as needed for a runny nose Continue Flonase 1-2 sprays in each nostril once a day as needed for a stuffy nose Consider nasal saline rinses as needed for nasal symptoms. Use before medicated sprays Allergen immunotherapy information packet mailed out today. Call us if you want to move forward with this option  Allergic conjunctivitis of both eyes Begin Pazeo eye drops one drop in each eye once a day as needed for red, itchy eyes  Continue the other medications as listed in your chart  Call the clinic if this treatment plan is not working well for you  Follow up in 2 months or sooner if needed  Return in about 2 months (around 02/19/2019), or if symptoms worsen or fail to improve.  Meds ordered this encounter  Medications  . montelukast (SINGULAIR) 10 MG tablet    Sig: Take 1 tablet (10 mg total) by mouth at bedtime.    Dispense:  30 tablet    Refill:  5  . cetirizine (ZYRTEC) 10 MG tablet    Sig: Take 1 tablet (10 mg total) by mouth daily as needed.    Dispense:  30 tablet    Refill:  5  . albuterol (VENTOLIN HFA) 108 (90 Base) MCG/ACT inhaler    Sig: Inhale 1-2 puffs into the lungs every 4 (four) hours as needed for wheezing or shortness of breath.    Dispense:  1 Inhaler    Refill:  2    Medication List:  Current Outpatient Medications  Medication Sig Dispense Refill  . acetaminophen (TYLENOL) 325 MG tablet Take  650 mg by mouth every 6 (six) hours as needed.    Marland Kitchen atomoxetine (STRATTERA) 10 MG capsule TK 1 C PO D    . cetirizine (ZYRTEC) 10 MG tablet Take 1 tablet (10 mg total) by mouth daily as needed. 30 tablet 5  . clindamycin (CLINDAGEL) 1 % gel     . fluticasone (FLONASE) 50 MCG/ACT nasal spray Place 2 sprays into both nostrils daily. 16 g 2  . ibuprofen (ADVIL,MOTRIN) 200 MG tablet Take 200 mg by mouth every 6 (six) hours as needed.    . montelukast (SINGULAIR) 10 MG tablet  Take 1 tablet (10 mg total) by mouth at bedtime. 30 tablet 5  . tretinoin (RETIN-A) 0.025 % cream     . albuterol (VENTOLIN HFA) 108 (90 Base) MCG/ACT inhaler Inhale 1-2 puffs into the lungs every 4 (four) hours as needed for wheezing or shortness of breath. 1 Inhaler 2  . Alcaftadine (LASTACAFT) 0.25 % SOLN Instill one drop in each eye once daily (Patient not taking: Reported on 12/20/2018) 3 mL 5  . desloratadine (CLARINEX) 5 MG tablet Take 1 tablet (5 mg total) by mouth daily. (Patient not taking: Reported on 01/14/2017) 30 tablet 0  . levocetirizine (XYZAL) 5 MG tablet Take 1 tablet (5 mg total) by mouth every evening. (Patient not taking: Reported on 12/20/2018) 90 tablet 0   No current facility-administered medications for this visit.    Allergies: No Known Allergies I reviewed her past medical history, social history, family history, and environmental history and no significant changes have been reported from previous visit on 01/14/2017.  Objective: Physical Exam Not obtained as encounter was done via WebEx.  Previous notes and tests were reviewed.  I discussed the assessment and treatment plan with the patient. The patient was provided an opportunity to ask questions and all were answered. The patient agreed with the plan and demonstrated an understanding of the instructions.   The patient was advised to call back or seek an in-person evaluation if the symptoms worsen or if the condition fails to improve as anticipated.  I provided 32 minutes of video-face-to-face time during this encounter.  It was my pleasure to participate in Jerome Overby's care today. Please feel free to contact me with any questions or concerns.   Sincerely,  Thermon Leyland, FNP   I have provided oversight concerning Thermon Leyland' evaluation and treatment of this patient's health issues addressed during today's encounter. I agree with the assessment and therapeutic plan as outlined in the note.   Thank you for the  opportunity to care for this patient.  Please do not hesitate to contact me with questions.  Tonette Bihari, M.D.  [facility] [address] [place], [place] [address] [phone]

## 2019-02-21 ENCOUNTER — Ambulatory Visit: Payer: 59 | Admitting: Family Medicine

## 2019-02-21 ENCOUNTER — Other Ambulatory Visit: Payer: Self-pay

## 2019-02-21 ENCOUNTER — Telehealth (INDEPENDENT_AMBULATORY_CARE_PROVIDER_SITE_OTHER): Payer: 59 | Admitting: Family Medicine

## 2019-02-21 ENCOUNTER — Encounter: Payer: Self-pay | Admitting: Family Medicine

## 2019-02-21 DIAGNOSIS — J4520 Mild intermittent asthma, uncomplicated: Secondary | ICD-10-CM | POA: Diagnosis not present

## 2019-02-21 DIAGNOSIS — L2084 Intrinsic (allergic) eczema: Secondary | ICD-10-CM | POA: Diagnosis not present

## 2019-02-21 DIAGNOSIS — H1013 Acute atopic conjunctivitis, bilateral: Secondary | ICD-10-CM

## 2019-02-21 DIAGNOSIS — J3089 Other allergic rhinitis: Secondary | ICD-10-CM

## 2019-02-21 MED ORDER — TRIAMCINOLONE ACETONIDE 0.1 % EX OINT: 1.0000 "application " | TOPICAL_OINTMENT | Freq: Two times a day (BID) | CUTANEOUS | 1 refills | Status: AC

## 2019-02-21 NOTE — Patient Instructions (Addendum)
Mild intermittent asthma without complication Continue montelukast 10 mg once a day to prevent cough and wheeze Continue Proventil 2 puffs every 4 hours as needed for cough and wheeze  Seasonal and perennial allergic rhinitis Continue cetirizine 10 mg once a day as needed for a runny nose. Change your antihistamine about every 3 months for best result. Some  Continue Flonase 1-2 sprays in each nostril once a day as needed for a stuffy nose Consider nasal saline rinses as needed for nasal symptoms. Use before medicated sprays Allergen immunotherapy information packet mailed out at the last visit. Call us if you want to move forward with this option  Allergic conjunctivitis of both eyes Begin Pazeo eye drops one drop in each eye once a day as needed for red, itchy eyes  Eczema Continue a daily moisturizing routine Begin triamcinolone 0.1% ointment twice a day as needed to red, itchy areas below your face and neck  Continue the other medications as listed in your chart  Call the clinic if this treatment plan is not working well for you  Follow up in the office 2 months or sooner if needed

## 2019-02-21 NOTE — Progress Notes (Addendum)
RE: Courtney Klein MRN: [id number] DOB: [birth year] Date of Telemedicine Visit: 02/21/2019  Referring provider: Vonna Drafts, FNP Primary care provider: Vonna Drafts, FNP  Chief Complaint: Asthma (asthma is doing fine. ), Allergic Reaction (states that everytime she went outside she would develop facial swelling and watery eyes.), and Rash (she wonders if her allergies may be causing some odd patchy dry skin areas. she has been using hydrocortisone cream on this. this has ben ongoing for about 3 months now. )   Telemedicine Follow Up Visit via Telephone: I connected with Courtney Klein for a follow up on 02/21/19 by telephone and verified that I am speaking with the correct person using two identifiers.   I discussed the limitations, risks, security and privacy concerns of performing an evaluation and management service by telephone and the availability of in person appointments. I also discussed with the patient that there may be a patient responsible charge related to this service. The patient expressed understanding and agreed to proceed.  Patient is at home  Provider is at the office.  Visit start time: 2:02 Visit end time: 2:25 Insurance consent/check in by: Jill Alexanders Medical consent and medical assistant/nurse: Lucrezia Starch  History of Present Illness: She is a 23 y.o. female, who is being followed for asthma, allergic rhinitis, and allergic conjunctivitis. Her previous allergy office visit was on 12/13/2018 with Gareth Morgan, [place]. At today's visit, she reports her asthma has been well controlled with no shortness of breath, cough, or wheeze. She reports currently taking montelukast 10 mg as needed and has not needed to use her albuterol inhaler. Allergic rhinitis is reported as not well controlled with nasal congestion and runny nose for which she continues to take Flonase as needed and cetirizine 2-3 times a week. Allergic conjunctivitis is reported as poorly controlled with  symptoms including itchy watery eyes for which she is not currently using any medical intervention. She reports a new problem today of red, dry, itchy skin on her legs, back, arms, and thighs that began 3 months ago. She is using a daily moisturizer and over the counter hydrocortisone with moderate improvement. She reports a childhood history of eczema, however, she has not experienced symptoms for several years. Her current medications are listed in the chart.    Assessment and Plan: Mild intermittent asthma without complication Continue montelukast 10 mg once a day to prevent cough and wheeze Continue Proventil 2 puffs every 4 hours as needed for cough and wheeze  Seasonal and perennial allergic rhinitis Continue cetirizine 10 mg once a day as needed for a runny nose Continue Flonase 1-2 sprays in each nostril once a day as needed for a stuffy nose Consider nasal saline rinses as needed for nasal symptoms. Use before medicated sprays Allergen immunotherapy information packet mailed out at the last visit. Call us if you want to move forward with this option  Allergic conjunctivitis of both eyes Begin Pazeo eye drops one drop in each eye once a day as needed for red, itchy eyes  Eczema Continue a daily moisturizing routine Begin triamcinolone 0.1% ointment twice a day as needed to red, itchy areas below your face and neck  Continue the other medications as listed in your chart  Call the clinic if this treatment plan is not working well for you  Follow up in the office 2 months or sooner if needed  Return in about 2 months (around 04/23/2019), or if symptoms worsen or fail to improve.  Meds  ordered this encounter  Medications  . triamcinolone ointment (KENALOG) 0.1 %    Sig: Apply 1 application topically 2 (two) times daily.    Dispense:  30 g    Refill:  1    Medication List:  Current Outpatient Medications  Medication Sig Dispense Refill  . acetaminophen (TYLENOL) 325 MG  tablet Take 650 mg by mouth every 6 (six) hours as needed.    Marland Kitchen. albuterol (VENTOLIN HFA) 108 (90 Base) MCG/ACT inhaler Inhale 1-2 puffs into the lungs every 4 (four) hours as needed for wheezing or shortness of breath. 1 Inhaler 2  . Alcaftadine (LASTACAFT) 0.25 % SOLN Instill one drop in each eye once daily 3 mL 5  . cetirizine (ZYRTEC) 10 MG tablet Take 1 tablet (10 mg total) by mouth daily as needed. 30 tablet 5  . desloratadine (CLARINEX) 5 MG tablet Take 1 tablet (5 mg total) by mouth daily. 30 tablet 0  . fluticasone (FLONASE) 50 MCG/ACT nasal spray Place 2 sprays into both nostrils daily. 16 g 2  . ibuprofen (ADVIL,MOTRIN) 200 MG tablet Take 200 mg by mouth every 6 (six) hours as needed.    . montelukast (SINGULAIR) 10 MG tablet Take 1 tablet (10 mg total) by mouth at bedtime. 30 tablet 5  . atomoxetine (STRATTERA) 10 MG capsule TK 1 C PO D    . clindamycin (CLINDAGEL) 1 % gel     . levocetirizine (XYZAL) 5 MG tablet Take 1 tablet (5 mg total) by mouth every evening. (Patient not taking: Reported on 12/20/2018) 90 tablet 0  . tretinoin (RETIN-A) 0.025 % cream     . triamcinolone ointment (KENALOG) 0.1 % Apply 1 application topically 2 (two) times daily. 30 g 1   No current facility-administered medications for this visit.    Allergies: No Known Allergies I reviewed her past medical history, social history, family history, and environmental history and no significant changes have been reported from previous visit on 12/20/2018.  Objective: Physical Exam Not obtained as encounter was done via telephone.   Previous notes and tests were reviewed.  I discussed the assessment and treatment plan with the patient. The patient was provided an opportunity to ask questions and all were answered. The patient agreed with the plan and demonstrated an understanding of the instructions.   The patient was advised to call back or seek an in-person evaluation if the symptoms worsen or if the condition  fails to improve as anticipated.  I provided 23 minutes of non-face-to-face time during this encounter.  It was my pleasure to participate in [name]'s care today. Please feel free to contact me with any questions or concerns.   Sincerely,  Thermon LeylandAnne Ambs, FNP   I have provided oversight concerning Thermon Leylandnne Ambs' evaluation and treatment of this patient's health issues addressed during today's encounter. I agree with the assessment and therapeutic plan as outlined in the note.   Thank you for the opportunity to care for this patient.  Please do not hesitate to contact me with questions.  Tonette BihariJ. A. Quinnlan Abruzzo, M.D.  [facility] [address] [place], [place] [id number] [phone]

## 2019-07-06 ENCOUNTER — Other Ambulatory Visit: Payer: Self-pay

## 2019-07-06 DIAGNOSIS — Z20822 Contact with and (suspected) exposure to covid-19: Secondary | ICD-10-CM

## 2019-07-08 LAB — NOVEL CORONAVIRUS, NAA: SARS-CoV-2, NAA: NOT DETECTED

## 2022-05-20 ENCOUNTER — Encounter (HOSPITAL_COMMUNITY): Payer: Self-pay

## 2022-05-20 ENCOUNTER — Emergency Department (HOSPITAL_COMMUNITY)
Admission: EM | Admit: 2022-05-20 | Discharge: 2022-05-20 | Discharge status: Against Medical Advice | Payer: Self-pay | Attending: Emergency Medicine | Admitting: Emergency Medicine

## 2022-05-20 DIAGNOSIS — M542 Cervicalgia: Secondary | ICD-10-CM | POA: Insufficient documentation

## 2022-05-20 DIAGNOSIS — Z5321 Procedure and treatment not carried out due to patient leaving prior to being seen by health care provider: Secondary | ICD-10-CM | POA: Insufficient documentation

## 2022-05-20 DIAGNOSIS — R300 Dysuria: Secondary | ICD-10-CM | POA: Insufficient documentation

## 2022-05-20 LAB — URINALYSIS, ROUTINE W REFLEX MICROSCOPIC
Bilirubin Urine: NEGATIVE
Glucose, UA: NEGATIVE mg/dL
Hgb urine dipstick: NEGATIVE
Ketones, ur: NEGATIVE mg/dL
Nitrite: POSITIVE — AB
Protein, ur: NEGATIVE mg/dL
Specific Gravity, Urine: 1.017 (ref 1.005–1.030)
WBC, UA: >50 WBC/hpf — ABNORMAL HIGH (ref 0–5)
pH: 6 (ref 5.0–8.0)

## 2022-05-20 NOTE — ED Triage Notes (Signed)
Pt arrived via POV, c/o left neck swelling and pain, had biopsy done that was negative, worsening sx. Also c/o dysuria.

## 2022-05-20 NOTE — ED Provider Triage Note (Signed)
Emergency Medicine Provider Triage Evaluation Note  Courtney Klein , a 26 y.o. female  was evaluated in triage.  Pt complains of urinary symptoms for the past 4 days.  Describing some dysuria, recent UTI approximately a month ago and was treated with antibiotics which give her a yeast infection.  She is sexually active, has gotten tested since without any abnormal test.  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  Review of Systems  Positive: Urinary symptoms, wound Negative: Back pain, fever, trouble swallowing   Physical Exam  BP 115/68 (BP Location: Left Arm)   Pulse 69   Temp 98.5 F (36.9 C)   Resp 18   SpO2 99%  Gen:   Awake, no distress   Resp:  Normal effort  MSK:   Moves extremities without difficulty  Other:  Large palpable mass along the left neck.  Her trachea is midline.  No CVA tenderness.  Medical Decision Making  Medically screening exam initiated at 5:58 PM.  Appropriate orders placed.  Courtney Klein was informed that the remainder of the evaluation will be completed by another provider, this initial triage assessment does not replace that evaluation, and the importance of remaining in the ED until their evaluation is complete.  UA has been ordered.    Janeece Fitting, PA-C 05/20/22 1802
